# Patient Record
Sex: Male | Born: 1965 | Hispanic: No | Marital: Married | Smoking: Never smoker
Health system: Southern US, Community
[De-identification: ages and names within clinical notes are randomized; demographics above are authoritative.]

## PROBLEM LIST (undated history)

## (undated) DIAGNOSIS — I10 Essential (primary) hypertension: Secondary | ICD-10-CM

## (undated) HISTORY — PX: RENAL ARTERY STENT: SHX2321

## (undated) HISTORY — PX: CORONARY ANGIOPLASTY WITH STENT PLACEMENT: SHX49

---

## 2010-01-12 ENCOUNTER — Inpatient Hospital Stay (HOSPITAL_COMMUNITY): Admission: EM | Admit: 2010-01-12 | Discharge: 2010-01-21 | Payer: Self-pay | Admitting: Emergency Medicine

## 2010-01-12 ENCOUNTER — Encounter: Payer: Self-pay | Admitting: Infectious Disease

## 2010-01-12 ENCOUNTER — Ambulatory Visit: Payer: Self-pay | Admitting: Infectious Disease

## 2010-01-21 ENCOUNTER — Encounter: Payer: Self-pay | Admitting: Internal Medicine

## 2010-01-21 DIAGNOSIS — I2510 Atherosclerotic heart disease of native coronary artery without angina pectoris: Secondary | ICD-10-CM | POA: Insufficient documentation

## 2010-01-21 DIAGNOSIS — E785 Hyperlipidemia, unspecified: Secondary | ICD-10-CM

## 2010-01-21 DIAGNOSIS — N259 Disorder resulting from impaired renal tubular function, unspecified: Secondary | ICD-10-CM | POA: Insufficient documentation

## 2010-01-21 DIAGNOSIS — I10 Essential (primary) hypertension: Secondary | ICD-10-CM | POA: Insufficient documentation

## 2010-01-28 ENCOUNTER — Inpatient Hospital Stay (HOSPITAL_COMMUNITY): Admission: EM | Admit: 2010-01-28 | Discharge: 2010-01-28 | Payer: Self-pay | Admitting: Emergency Medicine

## 2010-12-16 NOTE — Miscellaneous (Signed)
Summary: Hospital Discharge Update  Hospital Discharge  Date of admission:01/12/2010  Date of discharge:01/21/2010  Brief reason for admission/active problems: 1) Hypertensive emergency s/p cardiac cath, PCI RCA with bare metal stent  Followup needed: 1) Blood pressure 2) Medication compliance 3) BMet  4) Arrange for CT vs MRI abd for further work up of secondary HTN, urine metanephrines elevated  The medication and problem lists have been updated.  Please see the dictated discharge summary for details.

## 2010-12-16 NOTE — Miscellaneous (Signed)
Summary: Clinical List Update    Problems: Added new problem of CORONARY ARTERY DISEASE (ICD-414.00) Added new problem of HYPERLIPIDEMIA (ICD-272.4) Added new problem of HYPERTENSION (ICD-401.9) Added new problem of RENAL INSUFFICIENCY (ICD-588.9) Added new problem of FAMILY HISTORY OF CAD MALE 1ST DEGREE RELATIVE <50 (ICD-V17.3) Added new problem of FAMILY HISTORY OF ANEURYSM AORTIC (ICD-V17.4) Medications: Added new medication of ISOSORBIDE DINITRATE 30 MG TABS (ISOSORBIDE DINITRATE) Take 1 tablet by mouth once a day Added new medication of HYDRALAZINE HCL 50 MG TABS (HYDRALAZINE HCL) Take 1 tablet by mouth four times a day Added new medication of CARVEDILOL 25 MG TABS (CARVEDILOL) Take 1 tablet by mouth two times a day Added new medication of AMLODIPINE BESYLATE 10 MG TABS (AMLODIPINE BESYLATE) Take 1 tablet by mouth once a day Added new medication of THIAMINE 50 MG CAPS (THIAMINE HCL) Take 1 tablet by mouth once a day Added new medication of FOLIC ACID 1 MG TABS (FOLIC ACID) Take 1 tablet by mouth once a day Added new medication of SIMVASTATIN 10 MG TABS (SIMVASTATIN) Take 1 tab by mouth at bedtime Added new medication of FUROSEMIDE 40 MG TABS (FUROSEMIDE) Take 1 tablet by mouth once a day Added new medication of ASPIRIN 325 MG TABS (ASPIRIN) Take 1 tablet by mouth once a day Added new medication of PLAVIX 75 MG TABS (CLOPIDOGREL BISULFATE) Take 1 tablet by mouth once a day as directed by cardiologist. Added new medication of CLONAZEPAM 1 MG TABS (CLONAZEPAM) Take 1 tablet by mouth two times a day Allergies: Added new allergy or adverse reaction of * CONTRAST DYE Observations: Added new observation of INSTRUCTIONS: Please follow up with Dr. Mayford Knife on March 22, 2011at 2:15 pm. Dr. Norris Cross office number is [phone] Please follow up with Dr. Baltazar Apo of the [facility] on February 13, 2010 at 1:30 pm.  Please take all medications as prescribed. Check your Blood Pressure  regularly. If it is above 180: you should make an appointment. It is not healthy  for men to drink more than 2-3 drinks per day. Please refrain from alcohol use or keep to <1 drink per day.  (01/21/2010 15:40) Added new observation of MEDRECON: current updated (01/21/2010 15:40) Added new observation of ALLERGY REV: Done (01/21/2010 15:40) Added new observation of NKA: F (01/21/2010 15:40) Added new observation of SOCIAL HX: Occupation: Retired Sales executive, currently works as a Dietitian  Lives with wife and two children Chews tobacco Hx of heavy alcohol use, trying to cut back   (01/21/2010 15:40) Added new observation of WORK STATUS: employed (01/21/2010 15:40) Added new observation of FH HTN: Family History Hypertension (01/21/2010 15:40) Added new observation of FH-HIGHCHOL: Family History High cholesterol (01/21/2010 15:40) Added new observation of FHAORTAANEUR: Family History of Aneurysm Aortic (01/21/2010 15:40) Added new observation of FAMILY HX: Family History of CAD Male 1st degree relative <50 Family History of Aneurysm Aortic Family History High cholesterol Family History Hypertension  (01/21/2010 15:40) Added new observation of FH PREM CAD: Family History of CAD Male 1st degree relative <50 (01/21/2010 15:40) Added new observation of PMH ANGPL/ST: yes (01/21/2010 15:40) Added new observation of PAST SURG HX: Cardiac cath 01/20/2010 PTCA/stent  (01/21/2010 15:40) Added new observation of PAST MED HX: Coronary artery disease 2 vessel disease, s/p cardiac cath, PCI of RCA with bare mental stent, RCA 80% occlusion Hyperlipidemia Hypertension Renal insufficiency Hx of alcohol use   (01/21/2010 15:40) Added new observation of HX RENAL BAD: yes (01/21/2010 15:40) Added new observation  of HX OF HTN: yes (01/21/2010 15:40) Added new observation of HYPRLIPIDMIA: yes (01/21/2010 15:40) Added new observation of HX  CAD: yes (01/21/2010 15:40)      Complete  Medication List: 1)  Isosorbide Dinitrate 30 Mg Tabs (Isosorbide dinitrate) .... Take 1 tablet by mouth once a day 2)  Hydralazine Hcl 50 Mg Tabs (Hydralazine hcl) .... Take 1 tablet by mouth four times a day 3)  Carvedilol 25 Mg Tabs (Carvedilol) .... Take 1 tablet by mouth two times a day 4)  Amlodipine Besylate 10 Mg Tabs (Amlodipine besylate) .... Take 1 tablet by mouth once a day 5)  Thiamine 50 Mg Caps (Thiamine hcl) .... Take 1 tablet by mouth once a day 6)  Folic Acid 1 Mg Tabs (Folic acid) .... Take 1 tablet by mouth once a day 7)  Simvastatin 10 Mg Tabs (Simvastatin) .... Take 1 tab by mouth at bedtime 8)  Furosemide 40 Mg Tabs (Furosemide) .... Take 1 tablet by mouth once a day 9)  Aspirin 325 Mg Tabs (Aspirin) .... Take 1 tablet by mouth once a day 10)  Plavix 75 Mg Tabs (Clopidogrel bisulfate) .... Take 1 tablet by mouth once a day as directed by cardiologist. 11)  Clonazepam 1 Mg Tabs (Clonazepam) .... Take 1 tablet by mouth two times a day   Current Medications (verified): 1)  Isosorbide Dinitrate 30 Mg Tabs (Isosorbide Dinitrate) .... Take 1 Tablet By Mouth Once A Day 2)  Hydralazine Hcl 50 Mg Tabs (Hydralazine Hcl) .... Take 1 Tablet By Mouth Four Times A Day 3)  Carvedilol 25 Mg Tabs (Carvedilol) .... Take 1 Tablet By Mouth Two Times A Day 4)  Amlodipine Besylate 10 Mg Tabs (Amlodipine Besylate) .... Take 1 Tablet By Mouth Once A Day 5)  Thiamine 50 Mg Caps (Thiamine Hcl) .... Take 1 Tablet By Mouth Once A Day 6)  Folic Acid 1 Mg Tabs (Folic Acid) .... Take 1 Tablet By Mouth Once A Day 7)  Simvastatin 10 Mg Tabs (Simvastatin) .... Take 1 Tab By Mouth At Bedtime 8)  Furosemide 40 Mg Tabs (Furosemide) .... Take 1 Tablet By Mouth Once A Day 9)  Aspirin 325 Mg Tabs (Aspirin) .... Take 1 Tablet By Mouth Once A Day 10)  Plavix 75 Mg Tabs (Clopidogrel Bisulfate) .... Take 1 Tablet By Mouth Once A Day As Directed By Cardiologist. 11)  Clonazepam 1 Mg Tabs (Clonazepam) .... Take 1  Tablet By Mouth Two Times A Day  Allergies (verified): 1)  ! * Contrast Dye   Patient Instructions: 1)  Please follow up with Dr. Mayford Knife on March 22, 2011at 2:15 pm. Dr. Norris Cross office number is [phone] 2)  Please follow up with Dr. Baltazar Apo of the [facility] on February 13, 2010 at 1:30 pm.  3)  Please take all medications as prescribed. 4)  Check your Blood Pressure regularly. If it is above 180: you should make an appointment. 5)  It is not healthy  for men to drink more than 2-3 drinks per day. Please refrain from alcohol use or keep to <1 drink per day.    Past History:  Past Medical History: Coronary artery disease 2 vessel disease, s/p cardiac cath, PCI of RCA with bare mental stent, RCA 80% occlusion Hyperlipidemia Hypertension Renal insufficiency Hx of alcohol use   Past Surgical History: Cardiac cath 01/20/2010 PTCA/stent   Family History: Family History of CAD Male 1st degree relative <50 Family History of Aneurysm Aortic Family History High cholesterol  Family History Hypertension  Social History: Occupation: Retired Sales executive, currently works as a Dietitian  Lives with wife and two children Chews tobacco Hx of heavy alcohol use, trying to cut back

## 2011-02-05 LAB — APTT: aPTT: 37 seconds (ref 24–37)

## 2011-02-05 LAB — HEPATIC FUNCTION PANEL
ALT: 23 U/L (ref 0–53)
AST: 27 U/L (ref 0–37)
Albumin: 3.3 g/dL — ABNORMAL LOW (ref 3.5–5.2)
Alkaline Phosphatase: 72 U/L (ref 39–117)

## 2011-02-05 LAB — POCT I-STAT, CHEM 8
BUN: 23 mg/dL (ref 6–23)
Calcium, Ion: 1.04 mmol/L — ABNORMAL LOW (ref 1.12–1.32)
Chloride: 102 mEq/L (ref 96–112)
Creatinine, Ser: 1.6 mg/dL — ABNORMAL HIGH (ref 0.4–1.5)
TCO2: 24 mmol/L (ref 0–100)

## 2011-02-05 LAB — BASIC METABOLIC PANEL
BUN: 27 mg/dL — ABNORMAL HIGH (ref 6–23)
GFR calc Af Amer: 41 mL/min — ABNORMAL LOW (ref >60)
GFR calc non Af Amer: 34 mL/min — ABNORMAL LOW (ref >60)
GFR calc non Af Amer: 41 mL/min — ABNORMAL LOW (ref >60)
Glucose, Bld: 110 mg/dL — ABNORMAL HIGH (ref 70–99)
Potassium: 3.4 mEq/L — ABNORMAL LOW (ref 3.5–5.1)
Potassium: 3.7 mEq/L (ref 3.5–5.1)
Sodium: 132 mEq/L — ABNORMAL LOW (ref 135–145)

## 2011-02-05 LAB — DIFFERENTIAL
Eosinophils Absolute: 0 10*3/uL (ref 0.0–0.7)
Lymphocytes Relative: 7 % — ABNORMAL LOW (ref 12–46)
Neutro Abs: 13.3 10*3/uL — ABNORMAL HIGH (ref 1.7–7.7)

## 2011-02-05 LAB — LIPID PANEL
Cholesterol: 161 mg/dL (ref 0–200)
HDL: 30 mg/dL — ABNORMAL LOW (ref >39)
Total CHOL/HDL Ratio: 5.4 RATIO

## 2011-02-05 LAB — URINALYSIS, ROUTINE W REFLEX MICROSCOPIC
Glucose, UA: NEGATIVE mg/dL
Leukocytes, UA: NEGATIVE
Protein, ur: >300 mg/dL — AB
Specific Gravity, Urine: 1.016 (ref 1.005–1.030)
pH: 6 (ref 5.0–8.0)

## 2011-02-05 LAB — ALDOSTERONE, URINE, 24 HOUR: Volume, Urine-ALDU: 4400 mL

## 2011-02-05 LAB — PROTIME-INR
INR: 1.58 — ABNORMAL HIGH (ref 0.00–1.49)
INR: 1.71 — ABNORMAL HIGH (ref 0.00–1.49)
Prothrombin Time: 19.9 seconds — ABNORMAL HIGH (ref 11.6–15.2)

## 2011-02-05 LAB — COMPREHENSIVE METABOLIC PANEL
Albumin: 3 g/dL — ABNORMAL LOW (ref 3.5–5.2)
Creatinine, Ser: 1.37 mg/dL (ref 0.4–1.5)
GFR calc non Af Amer: 56 mL/min — ABNORMAL LOW (ref >60)
Glucose, Bld: 159 mg/dL — ABNORMAL HIGH (ref 70–99)
Potassium: 3 mEq/L — ABNORMAL LOW (ref 3.5–5.1)
Total Protein: 6.3 g/dL (ref 6.0–8.3)

## 2011-02-05 LAB — CBC
HCT: 40.9 % (ref 39.0–52.0)
Hemoglobin: 13.2 g/dL (ref 13.0–17.0)
MCHC: 34.3 g/dL (ref 30.0–36.0)
MCV: 86.9 fL (ref 78.0–100.0)
RBC: 4.38 MIL/uL (ref 4.22–5.81)
WBC: 14.9 10*3/uL — ABNORMAL HIGH (ref 4.0–10.5)

## 2011-02-05 LAB — MRSA PCR SCREENING: MRSA by PCR: NEGATIVE

## 2011-02-05 LAB — CATECHOLAMINES, FRACTIONATED, URINE, 24 HOUR
Epinephrine 24 Hr Urine: 11 mcg/24 h (ref 2–24)
Norepinephrine 24 Hr Urine: 110 mcg/24 h — ABNORMAL HIGH (ref 15–100)

## 2011-02-05 LAB — CK TOTAL AND CKMB (NOT AT ARMC)
Relative Index: INVALID (ref 0.0–2.5)
Total CK: 47 U/L (ref 7–232)

## 2011-02-05 LAB — MAGNESIUM: Magnesium: 1.9 mg/dL (ref 1.5–2.5)

## 2011-02-05 LAB — METANEPHRINES, URINE, 24 HOUR
Metanephrines, Ur: 456 mcg/24 h — ABNORMAL HIGH (ref 58–203)
Volume, Urine-METAN: 4400 mL

## 2011-02-05 LAB — URINE MICROSCOPIC-ADD ON

## 2011-02-05 LAB — CARDIAC PANEL(CRET KIN+CKTOT+MB+TROPI)
CK, MB: 1.8 ng/mL (ref 0.3–4.0)
Troponin I: 0.25 ng/mL — ABNORMAL HIGH (ref 0.00–0.06)

## 2011-02-05 LAB — HEPARIN LEVEL (UNFRACTIONATED): Heparin Unfractionated: <0.1 IU/mL — ABNORMAL LOW (ref 0.30–0.70)

## 2011-02-05 LAB — BRAIN NATRIURETIC PEPTIDE: Pro B Natriuretic peptide (BNP): 1056 pg/mL — ABNORMAL HIGH (ref 0.0–100.0)

## 2011-02-05 LAB — VMA + CREATININE, URINE (TIMED COLLECTION): Volume, Urine-VMAUR: 4400 mL

## 2011-02-05 LAB — CREATININE, URINE, RANDOM: Creatinine, Urine: 125.9 mg/dL

## 2011-02-05 LAB — ALDOSTERONE + RENIN ACTIVITY W/ RATIO
ALDO / PRA Ratio: 121.7 Ratio — ABNORMAL HIGH (ref 0.9–28.9)
PRA LC/MS/MS: 0.23 ng/mL/h — ABNORMAL LOW (ref 0.25–5.82)

## 2011-02-05 LAB — TROPONIN I: Troponin I: 0.07 ng/mL — ABNORMAL HIGH (ref 0.00–0.06)

## 2011-02-09 LAB — COMPREHENSIVE METABOLIC PANEL
Albumin: 3.1 g/dL — ABNORMAL LOW (ref 3.5–5.2)
Alkaline Phosphatase: 83 U/L (ref 39–117)
BUN: 23 mg/dL (ref 6–23)
Chloride: 101 mEq/L (ref 96–112)
Creatinine, Ser: 1.47 mg/dL (ref 0.4–1.5)
GFR calc non Af Amer: 52 mL/min — ABNORMAL LOW (ref >60)
Glucose, Bld: 90 mg/dL (ref 70–99)
Potassium: 4.3 mEq/L (ref 3.5–5.1)
Total Bilirubin: 0.8 mg/dL (ref 0.3–1.2)

## 2011-02-09 LAB — BASIC METABOLIC PANEL
BUN: 13 mg/dL (ref 6–23)
BUN: 13 mg/dL (ref 6–23)
BUN: 14 mg/dL (ref 6–23)
BUN: 14 mg/dL (ref 6–23)
CO2: 20 mEq/L (ref 19–32)
CO2: 20 mEq/L (ref 19–32)
CO2: 22 mEq/L (ref 19–32)
CO2: 24 mEq/L (ref 19–32)
Calcium: 8.2 mg/dL — ABNORMAL LOW (ref 8.4–10.5)
Calcium: 8.2 mg/dL — ABNORMAL LOW (ref 8.4–10.5)
Calcium: 8.5 mg/dL (ref 8.4–10.5)
Calcium: 8.6 mg/dL (ref 8.4–10.5)
Calcium: 8.8 mg/dL (ref 8.4–10.5)
Chloride: 101 mEq/L (ref 96–112)
Chloride: 104 mEq/L (ref 96–112)
Chloride: 106 mEq/L (ref 96–112)
Chloride: 108 mEq/L (ref 96–112)
Chloride: 96 mEq/L (ref 96–112)
Creatinine, Ser: 1.37 mg/dL (ref 0.4–1.5)
Creatinine, Ser: 1.42 mg/dL (ref 0.4–1.5)
Creatinine, Ser: 1.51 mg/dL — ABNORMAL HIGH (ref 0.4–1.5)
Creatinine, Ser: 1.57 mg/dL — ABNORMAL HIGH (ref 0.4–1.5)
Creatinine, Ser: 2.33 mg/dL — ABNORMAL HIGH (ref 0.4–1.5)
GFR calc Af Amer: 37 mL/min — ABNORMAL LOW (ref >60)
GFR calc Af Amer: 58 mL/min — ABNORMAL LOW (ref >60)
GFR calc Af Amer: 59 mL/min — ABNORMAL LOW (ref >60)
GFR calc Af Amer: 60 mL/min — ABNORMAL LOW (ref >60)
GFR calc Af Amer: >60 mL/min (ref >60)
GFR calc non Af Amer: 30 mL/min — ABNORMAL LOW (ref >60)
GFR calc non Af Amer: 42 mL/min — ABNORMAL LOW (ref >60)
GFR calc non Af Amer: 49 mL/min — ABNORMAL LOW (ref >60)
GFR calc non Af Amer: 54 mL/min — ABNORMAL LOW (ref >60)
Glucose, Bld: 102 mg/dL — ABNORMAL HIGH (ref 70–99)
Glucose, Bld: 85 mg/dL (ref 70–99)
Glucose, Bld: 93 mg/dL (ref 70–99)
Glucose, Bld: 94 mg/dL (ref 70–99)
Potassium: 3.6 mEq/L (ref 3.5–5.1)
Potassium: 3.7 mEq/L (ref 3.5–5.1)
Potassium: 3.7 mEq/L (ref 3.5–5.1)
Potassium: 3.9 mEq/L (ref 3.5–5.1)
Potassium: 4.1 mEq/L (ref 3.5–5.1)
Potassium: 4.5 mEq/L (ref 3.5–5.1)
Sodium: 134 mEq/L — ABNORMAL LOW (ref 135–145)
Sodium: 134 mEq/L — ABNORMAL LOW (ref 135–145)
Sodium: 134 mEq/L — ABNORMAL LOW (ref 135–145)
Sodium: 134 mEq/L — ABNORMAL LOW (ref 135–145)
Sodium: 135 mEq/L (ref 135–145)
Sodium: 136 mEq/L (ref 135–145)

## 2011-02-09 LAB — CBC
HCT: 36.7 % — ABNORMAL LOW (ref 39.0–52.0)
HCT: 36.8 % — ABNORMAL LOW (ref 39.0–52.0)
HCT: 37.8 % — ABNORMAL LOW (ref 39.0–52.0)
HCT: 40.6 % (ref 39.0–52.0)
HCT: 42.1 % (ref 39.0–52.0)
Hemoglobin: 12.4 g/dL — ABNORMAL LOW (ref 13.0–17.0)
Hemoglobin: 13.2 g/dL (ref 13.0–17.0)
Hemoglobin: 13.6 g/dL (ref 13.0–17.0)
Hemoglobin: 14.1 g/dL (ref 13.0–17.0)
MCHC: 33.7 g/dL (ref 30.0–36.0)
MCHC: 34 g/dL (ref 30.0–36.0)
MCV: 86.2 fL (ref 78.0–100.0)
MCV: 86.4 fL (ref 78.0–100.0)
MCV: 86.6 fL (ref 78.0–100.0)
MCV: 87.2 fL (ref 78.0–100.0)
Platelets: 245 10*3/uL (ref 150–400)
Platelets: 258 10*3/uL (ref 150–400)
Platelets: 264 10*3/uL (ref 150–400)
Platelets: 276 10*3/uL (ref 150–400)
Platelets: 364 10*3/uL (ref 150–400)
RBC: 4.15 MIL/uL — ABNORMAL LOW (ref 4.22–5.81)
RBC: 4.21 MIL/uL — ABNORMAL LOW (ref 4.22–5.81)
RBC: 4.35 MIL/uL (ref 4.22–5.81)
RBC: 4.58 MIL/uL (ref 4.22–5.81)
RBC: 4.61 MIL/uL (ref 4.22–5.81)
RBC: 4.89 MIL/uL (ref 4.22–5.81)
RDW: 15.9 % — ABNORMAL HIGH (ref 11.5–15.5)
RDW: 16.2 % — ABNORMAL HIGH (ref 11.5–15.5)
WBC: 10.2 10*3/uL (ref 4.0–10.5)
WBC: 10.3 10*3/uL (ref 4.0–10.5)
WBC: 10.4 10*3/uL (ref 4.0–10.5)
WBC: 12.7 10*3/uL — ABNORMAL HIGH (ref 4.0–10.5)
WBC: 8.2 10*3/uL (ref 4.0–10.5)
WBC: 9.3 10*3/uL (ref 4.0–10.5)
WBC: 9.7 10*3/uL (ref 4.0–10.5)

## 2011-02-09 LAB — CLOSTRIDIUM DIFFICILE EIA

## 2011-02-09 LAB — TROPONIN I: Troponin I: 0.06 ng/mL (ref 0.00–0.06)

## 2011-02-09 LAB — DIFFERENTIAL
Eosinophils Absolute: 0.1 10*3/uL (ref 0.0–0.7)
Lymphs Abs: 1 10*3/uL (ref 0.7–4.0)
Monocytes Absolute: 0.8 10*3/uL (ref 0.1–1.0)
Monocytes Relative: 6 % (ref 3–12)
Neutrophils Relative %: 84 % — ABNORMAL HIGH (ref 43–77)

## 2011-02-09 LAB — GLUCOSE, CAPILLARY: Glucose-Capillary: 129 mg/dL — ABNORMAL HIGH (ref 70–99)

## 2011-02-09 LAB — HEPARIN LEVEL (UNFRACTIONATED)
Heparin Unfractionated: 0.25 IU/mL — ABNORMAL LOW (ref 0.30–0.70)
Heparin Unfractionated: 0.29 IU/mL — ABNORMAL LOW (ref 0.30–0.70)

## 2011-02-09 LAB — POCT CARDIAC MARKERS: Myoglobin, poc: 135 ng/mL (ref 12–200)

## 2011-02-09 LAB — CK TOTAL AND CKMB (NOT AT ARMC): CK, MB: 0.6 ng/mL (ref 0.3–4.0)

## 2011-02-09 LAB — BRAIN NATRIURETIC PEPTIDE: Pro B Natriuretic peptide (BNP): 970 pg/mL — ABNORMAL HIGH (ref 0.0–100.0)

## 2011-02-09 LAB — CARDIAC PANEL(CRET KIN+CKTOT+MB+TROPI)
Relative Index: INVALID (ref 0.0–2.5)
Troponin I: 0.03 ng/mL (ref 0.00–0.06)

## 2011-05-19 IMAGING — CR DG CHEST 1V PORT
1 series · 1 of 1 positions shown · non-contrast
Comparison: None.

CLINICAL DATA: Chest pain and short of breath.  Uncontrolled
hypertension.

PORTABLE CHEST - 1 VIEW

[AP]
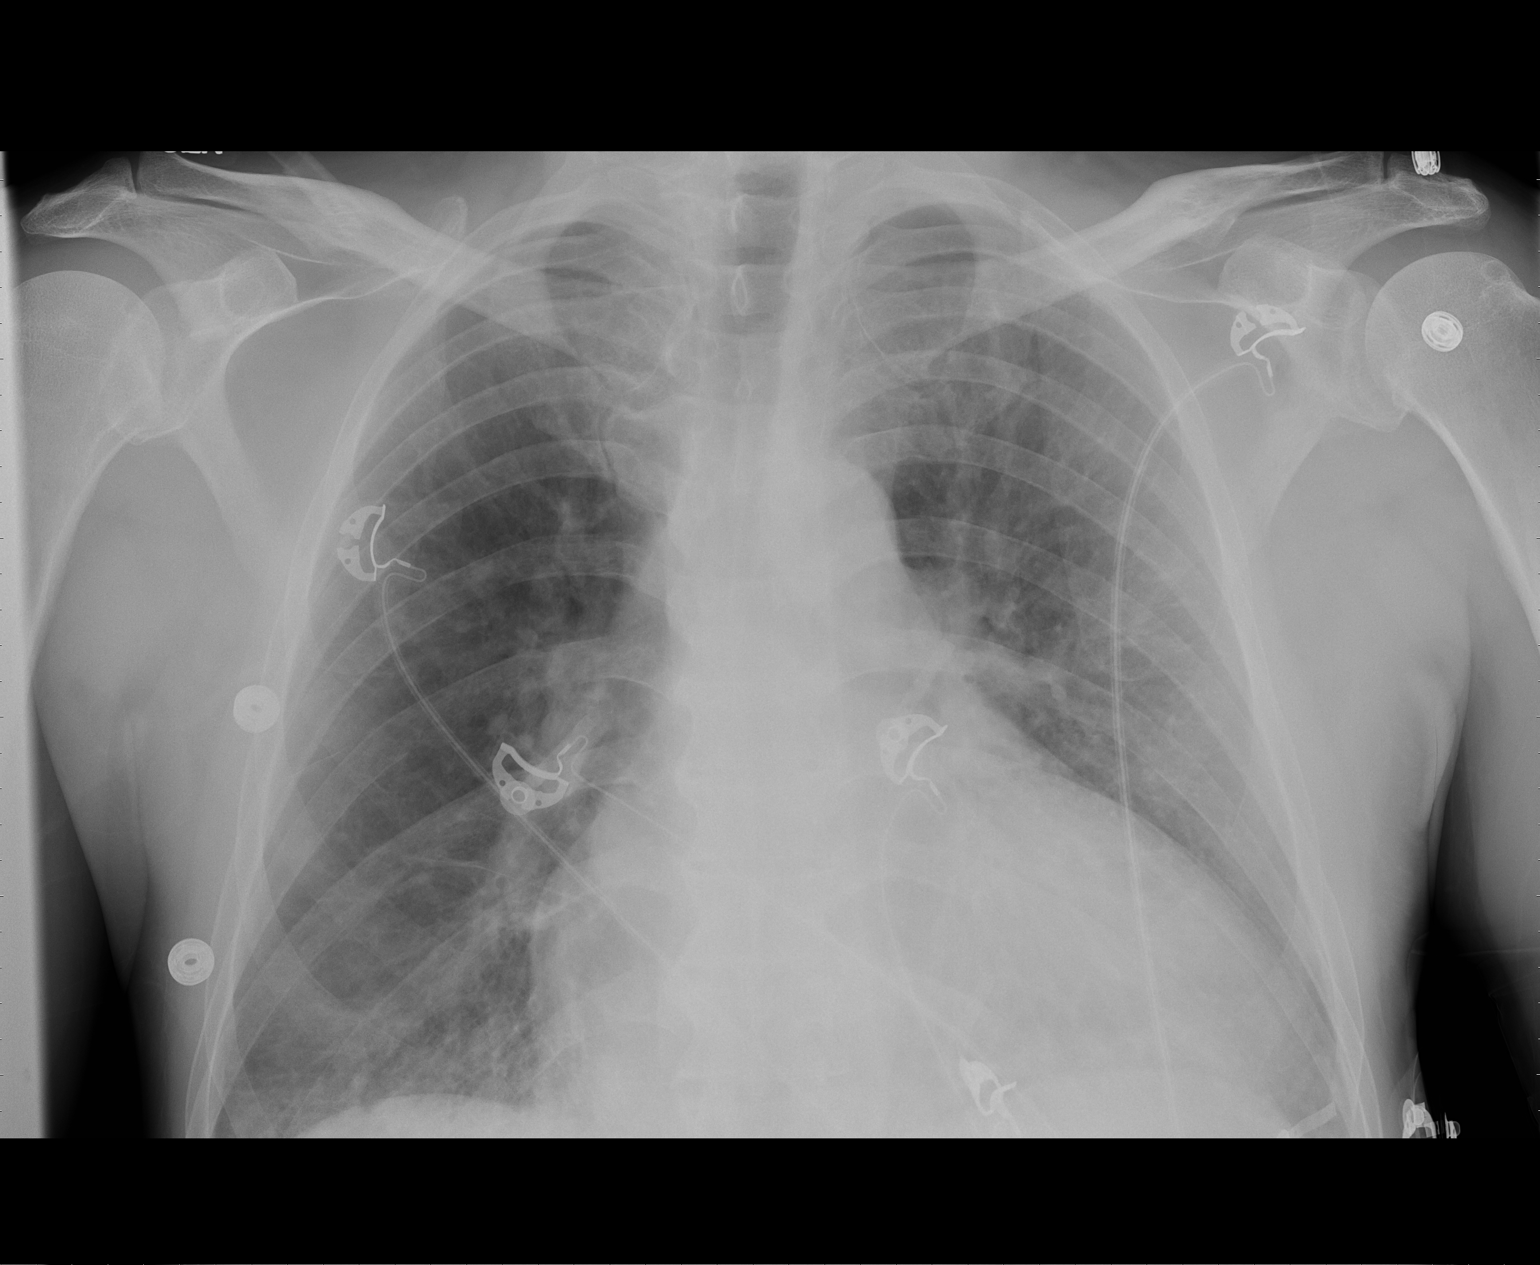

[1 of 1 positions shown; findings below may reference images not displayed]

FINDINGS: Moderate cardiomegaly is seen.  Mild infiltrate is seen
at the right lung base.  No evidence of pleural effusion.
IMPRESSION: Moderate cardiomegaly.  Mild infiltrate at the right lung base.
Acute infectious or inflammatory process cannot be excluded.

## 2011-05-19 IMAGING — CT CT ANGIO CHEST
2 of 8 series · 11 of 36 positions shown · IV contrast (100 ML OMNI 350)
Comparison: None

CTA CHEST

CLINICAL DATA: Severe chest and back pain.

CT ANGIOGRAPHY CHEST AND ABDOMEN
TECHNIQUE: Multidetector CT imaging of the chest and abdomen was
performed using the standard protocol during bolus administration
of intravenous contrast.  Multiplanar CT image reconstructions
including MIPs were obtained to evaluate the vascular anatomy.
Contrast: 100 ml Omnipaque- 350.

[Series 4: aortic dissection · axial · 0.77mm/px · z∈[-436,-79]mm · 10 of 175 slices shown]
[im 16/175  lung]
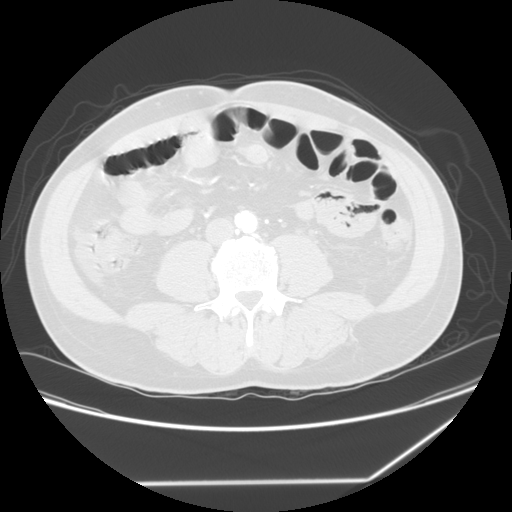
[im 32/175  mediastinal]
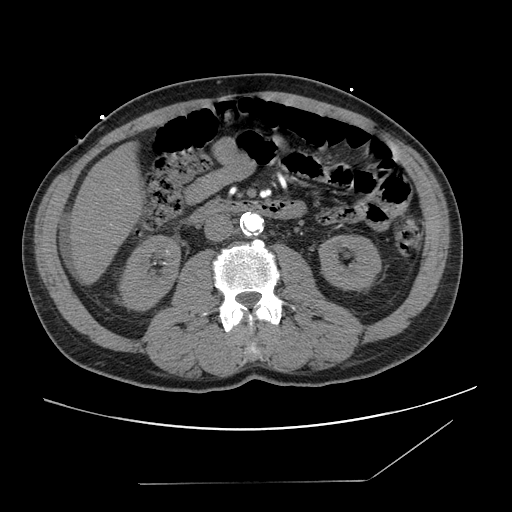
[im 48/175  lung]
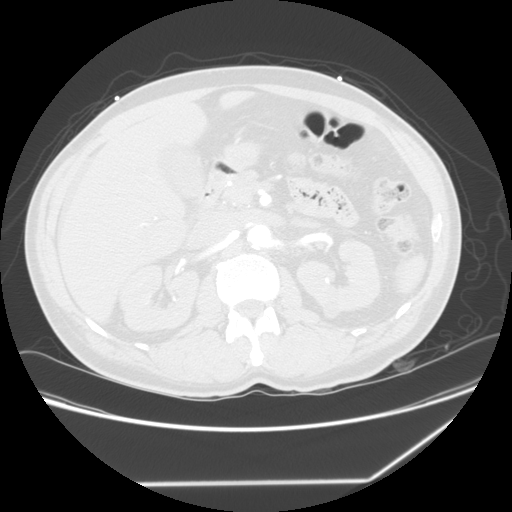
[im 64/175  mediastinal]
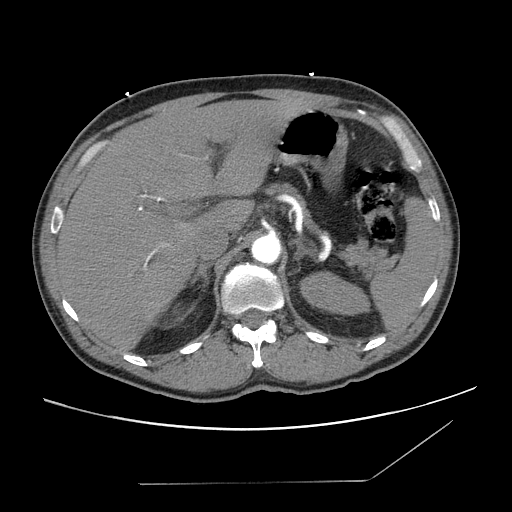
[im 80/175  lung]
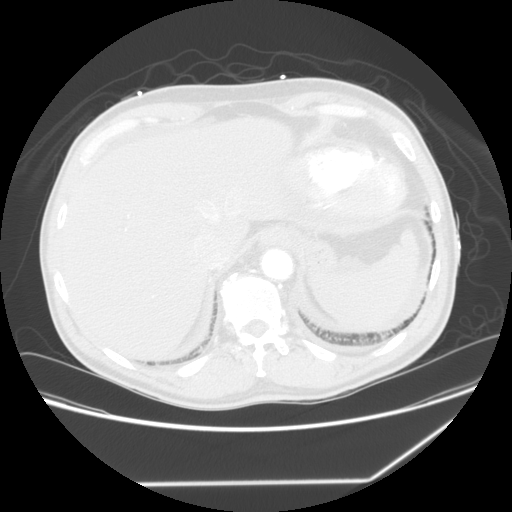
[im 95/175  mediastinal]
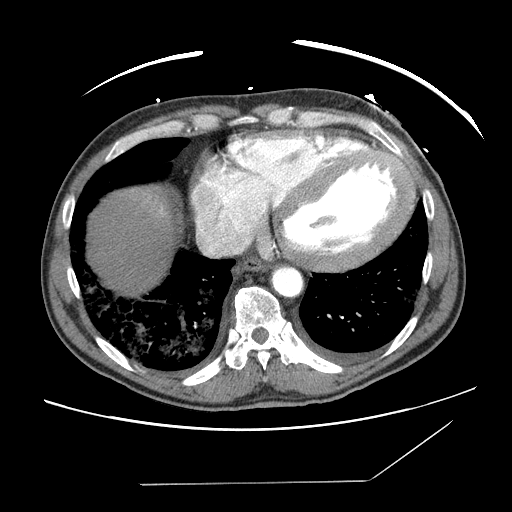
[im 111/175  lung]
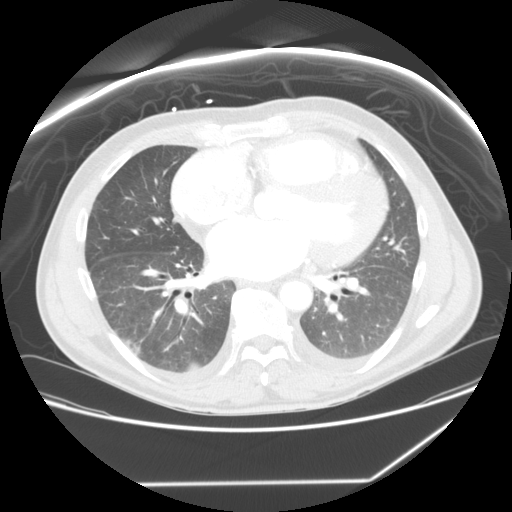
[im 127/175  mediastinal]
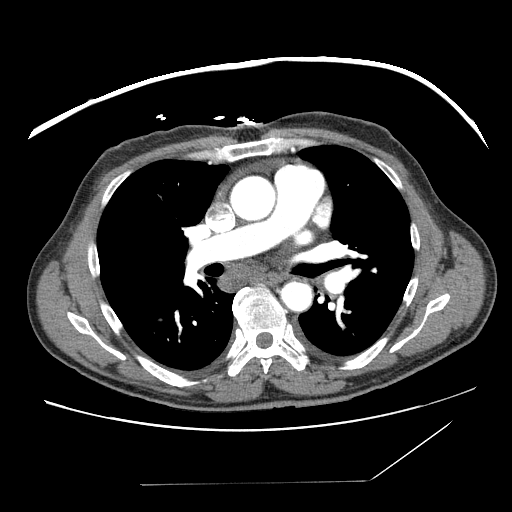
[im 143/175  lung]
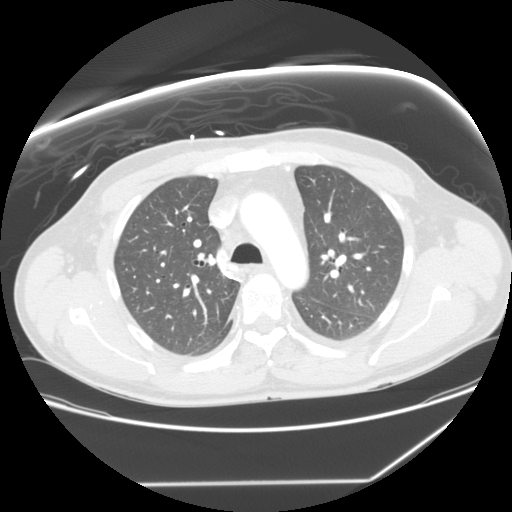
[im 159/175  mediastinal]
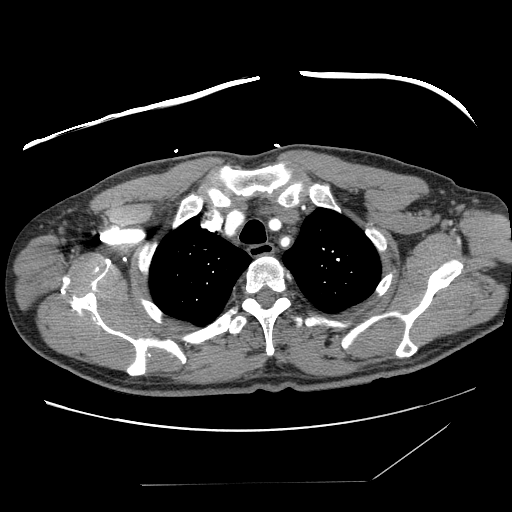

[Series 600: cor mpr · coronal · 0.87mm/px · 1 of 134 slices shown]
[im 67/134  mediastinal]
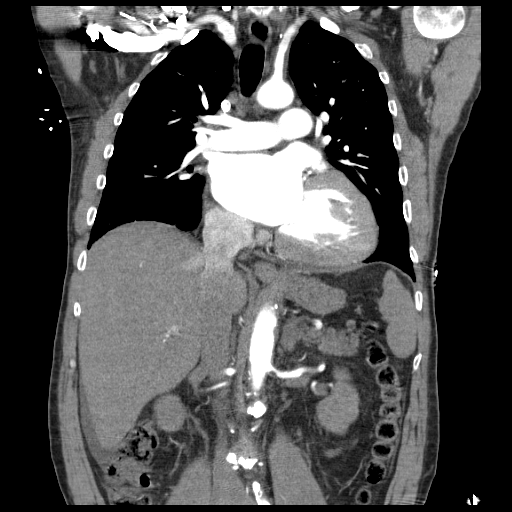

[11 of 36 positions shown; findings below may reference images not displayed]

FINDINGS: The chest wall is unremarkable.  No supraclavicular or
axillary adenopathy.  The bony thorax is intact.  Moderate
degenerative changes throughout the thoracic spine for age.

The heart is enlarged globally.  No pericardial effusion.
Borderline enlarged mediastinal and hilar lymph nodes.  The
esophagus is grossly normal.

The aorta is normal in caliber.  No dissection.  The major branch
vessels are normal  except for mild atherosclerotic disease
involving the origin of the left subclavian artery. Coronary artery
calcifications are noted.

The pulmonary arterial tree there is a fairly well opacified.  No
pulmonary emboli are seen.

Examination of the lung parenchyma demonstrates bibasilar airspace
opacities and interstitial thickening.  Findings likely due to
bronchopneumonia.  No definite pleural effusions.

There are too small pulmonary nodules in the right lung.  The right
upper lobe lesion on image number 20 measures 4.7 mm and the right
middle lobe lesion on image number 54 measures 6 mm.  A follow-up
noncontrast chest CT in 6 months is suggested.

 Review of the MIP images confirms the above findings.
IMPRESSION: 1.  No thoracic aortic dissection or aneurysm.
2.  Cardiac enlargement and coronary artery calcifications.
3.  Borderline enlarged mediastinal and hilar lymph nodes are
likely reactive/hyperplastic.
4.  Bibasilar infiltrates.
5.  Two small pulmonary nodules in the right lung.  Recommend
follow-up noncontrast chest CT in 6 months.

CTA ABDOMEN
FINDINGS: There are atherosclerotic changes involving the
abdominal aorta and branch vessels but no dissection or aneurysm.
Advanced atherosclerotic calcifications involving the common iliac
arteries but no dissection or aneurysm.

The liver, spleen, pancreas, adrenal glands and kidneys demonstrate
no significant findings.

The gallbladder wall is thickened and edematous.  Ther may be
pericholecystic fluid.  No obvious gallstones but acute
cholecystitis is possible.  Low albumin could also give this
appearance.  If the patient has right upper quadrant pain,
abdominal ultrasound examination is suggested.

The stomach, duodenum, small bowel and colon grossly normal but the
study is limited without oral contrast.  There is a small amount of
free abdominal fluid in the right paracolic gutter.

 Review of the MIP images confirms the above findings.
IMPRESSION: 1.  Atherosclerotic changes involving the aorta but no focal
aneurysm or dissection.
2.  Gallbladder wall thickening and possible pericholecystic fluid.
Acute cholecystitis is possible.  If this patient has right upper
quadrant pain an abdominal ultrasound examination is suggested.

## 2012-03-21 ENCOUNTER — Emergency Department (HOSPITAL_COMMUNITY)
Admission: EM | Admit: 2012-03-21 | Discharge: 2012-03-21 | Disposition: A | Discharge status: Home / Self Care | Attending: Emergency Medicine | Admitting: Emergency Medicine

## 2012-03-21 ENCOUNTER — Encounter (HOSPITAL_COMMUNITY): Payer: Self-pay | Admitting: Emergency Medicine

## 2012-03-21 DIAGNOSIS — Y92009 Unspecified place in unspecified non-institutional (private) residence as the place of occurrence of the external cause: Secondary | ICD-10-CM | POA: Insufficient documentation

## 2012-03-21 DIAGNOSIS — IMO0002 Reserved for concepts with insufficient information to code with codable children: Secondary | ICD-10-CM

## 2012-03-21 DIAGNOSIS — W269XXA Contact with unspecified sharp object(s), initial encounter: Secondary | ICD-10-CM | POA: Insufficient documentation

## 2012-03-21 DIAGNOSIS — S61409A Unspecified open wound of unspecified hand, initial encounter: Secondary | ICD-10-CM | POA: Insufficient documentation

## 2012-03-21 DIAGNOSIS — I10 Essential (primary) hypertension: Secondary | ICD-10-CM | POA: Insufficient documentation

## 2012-03-21 HISTORY — DX: Essential (primary) hypertension: I10

## 2012-03-21 MED ORDER — HYDROCODONE-ACETAMINOPHEN 5-325 MG PO TABS: 1.0000 | ORAL_TABLET | Freq: Four times a day (QID) | ORAL | Status: AC | PRN

## 2012-03-21 NOTE — ED Provider Notes (Signed)
History     CSN: [id number]  Arrival date & time 03/21/12  2143   First MD Initiated Contact with Patient 03/21/12 2208      Chief Complaint  Patient presents with  . Hand Injury    (Consider location/radiation/quality/duration/timing/severity/associated sxs/prior treatment) HPI Comments: tdap up to date  Patient is a 46 y.o. male presenting with hand injury. The history is provided by the patient.  Hand Injury  The incident occurred less than 1 hour ago. The incident occurred at home. The injury mechanism was an incision. The pain is present in the right hand. The quality of the pain is described as throbbing. The pain is at a severity of 3/10. The pain is mild. The pain has been fluctuating since the incident. Pertinent negatives include no fever. He reports no foreign bodies present. The symptoms are aggravated by palpation and movement. He has tried nothing for the symptoms. The treatment provided no relief.    Past Medical History  Diagnosis Date  . Hypertension     Past Surgical History  Procedure Date  . Coronary angioplasty with stent placement     Family History  Problem Relation Age of Onset  . Hypertension Other   . Coronary artery disease Other     History  Substance Use Topics  . Smoking status: Never Smoker   . Smokeless tobacco: Not on file  . Alcohol Use: No      Review of Systems  Constitutional: Negative for fever.  Gastrointestinal: Negative for nausea.  Skin: Positive for wound.  Neurological: Negative for weakness.  All other systems reviewed and are negative.    Allergies  Review of patient's allergies indicates no known allergies.  Home Medications   Current Outpatient Rx  Name Route Sig Dispense Refill  . AMLODIPINE BESYLATE 10 MG PO TABS Oral Take 10 mg by mouth daily.    Marland Kitchen CARVEDILOL 25 MG PO TABS Oral Take 50 mg by mouth 2 (two) times daily with a meal.    . CLONIDINE HCL 0.3 MG PO TABS Oral Take 0.3 mg by mouth 2 (two) times  daily.    Marland Kitchen CLOPIDOGREL BISULFATE 75 MG PO TABS Oral Take 75 mg by mouth daily.    Marland Kitchen HYDRALAZINE HCL 50 MG PO TABS Oral Take 50 mg by mouth 2 (two) times daily.    Marland Kitchen LISINOPRIL 40 MG PO TABS Oral Take 40 mg by mouth 2 (two) times daily.    Marland Kitchen SIMVASTATIN 20 MG PO TABS Oral Take 20 mg by mouth every evening.    Marland Kitchen TADALAFIL 5 MG PO TABS Oral Take 5 mg by mouth daily as needed. E.D.    . VITAMIN D (CHOLECALCIFEROL) PO Oral Take 1 tablet by mouth daily.      BP 208/101  Temp(Src) 98.5 F (36.9 C) (Oral)  Resp 20  SpO2 100%  Physical Exam  Nursing note and vitals reviewed. Constitutional: He is oriented to person, place, and time. He appears well-developed and well-nourished. No distress.  HENT:  Head: Normocephalic and atraumatic.  Eyes: Conjunctivae and EOM are normal.  Neck: Normal range of motion.  Pulmonary/Chest: Effort normal.  Musculoskeletal: Normal range of motion.  Neurological: He is alert and oriented to person, place, and time.  Skin: Skin is warm and dry. Laceration noted. No rash noted. He is not diaphoretic.       2 cm lac on dorsal surface of R hand just below the index finger  Psychiatric: He has a normal mood  and affect. His behavior is normal.    ED Course  Procedures (including critical care time)  Labs Reviewed - No data to display No results found.   No diagnosis found.  LACERATION REPAIR Performed by: Jaci Carrel Authorized by: Jaci Carrel Consent: Verbal consent obtained. Risks and benefits: risks, benefits and alternatives were discussed Consent given by: patient Patient identity confirmed: provided demographic data Prepped and Draped in normal sterile fashion Wound explored  Laceration Location: dorsal surface of right hand  Laceration Length: 2cm  No Foreign Bodies seen or palpated  Anesthesia: local infiltration  Local anesthetic: lidocaine 2% no epinephrine  Anesthetic total: 1.5 ml  Irrigation method: syringe Amount of  cleaning: standard  Skin closure: 4.0 prolene  Number of sutures: 3  Technique: simple interupted  Patient tolerance: Patient tolerated the procedure well with no immediate complications.  Pt blood pressure in ED was 208/101, discussed and pt denies any HA, vision, changes and does not have signs of end organ damage. Pt takes clonidine BID and has not had his evening dose  MDM  Laceration Hypertension  Tdap up to date. Pressure irrigation performed. Laceration occurred < 8 hours prior to repair which was well tolerated. Pt has no co morbidities to effect normal wound healing. Discussed suture home care w pt and answered questions. Pt to f-u for wound check and suture removal in 7 days. Pt is hemodynamically stable w no complaints prior to dc.  Recommended f-u with PCP to re-evaluate BP         Jaci Carrel, PA-C 03/21/12 2316

## 2012-03-21 NOTE — ED Notes (Signed)
Pt states he reached under his car seat to get his keys and something cut his left hand  Pt has a small laceration noted to his knuckle on his left hand

## 2012-03-21 NOTE — ED Provider Notes (Signed)
Medical screening examination/treatment/procedure(s) were performed by non-physician practitioner and as supervising physician I was immediately available for consultation/collaboration.   Lyanne Co, MD 03/21/12 [phone]

## 2012-07-11 ENCOUNTER — Emergency Department (HOSPITAL_COMMUNITY)

## 2012-07-11 ENCOUNTER — Encounter (HOSPITAL_COMMUNITY): Payer: Self-pay | Admitting: Emergency Medicine

## 2012-07-11 ENCOUNTER — Emergency Department (HOSPITAL_COMMUNITY)
Admission: EM | Admit: 2012-07-11 | Discharge: 2012-07-12 | Disposition: A | Discharge status: Home / Self Care | Attending: Emergency Medicine | Admitting: Emergency Medicine

## 2012-07-11 DIAGNOSIS — I517 Cardiomegaly: Secondary | ICD-10-CM | POA: Insufficient documentation

## 2012-07-11 DIAGNOSIS — Z79899 Other long term (current) drug therapy: Secondary | ICD-10-CM | POA: Insufficient documentation

## 2012-07-11 DIAGNOSIS — R079 Chest pain, unspecified: Secondary | ICD-10-CM | POA: Insufficient documentation

## 2012-07-11 DIAGNOSIS — R0789 Other chest pain: Secondary | ICD-10-CM

## 2012-07-11 DIAGNOSIS — F411 Generalized anxiety disorder: Secondary | ICD-10-CM | POA: Insufficient documentation

## 2012-07-11 DIAGNOSIS — I10 Essential (primary) hypertension: Secondary | ICD-10-CM | POA: Insufficient documentation

## 2012-07-11 LAB — CBC WITH DIFFERENTIAL/PLATELET
Basophils Absolute: 0 10*3/uL (ref 0.0–0.1)
Basophils Relative: 0 % (ref 0–1)
HCT: 39 % (ref 39.0–52.0)
Hemoglobin: 14.4 g/dL (ref 13.0–17.0)
Lymphocytes Relative: 22 % (ref 12–46)
MCHC: 36.9 g/dL — ABNORMAL HIGH (ref 30.0–36.0)
Monocytes Absolute: 0.6 10*3/uL (ref 0.1–1.0)
Monocytes Relative: 8 % (ref 3–12)
Neutro Abs: 5.3 10*3/uL (ref 1.7–7.7)
Neutrophils Relative %: 68 % (ref 43–77)
WBC: 7.8 10*3/uL (ref 4.0–10.5)

## 2012-07-11 LAB — COMPREHENSIVE METABOLIC PANEL
ALT: 22 U/L (ref 0–53)
BUN: 20 mg/dL (ref 6–23)
CO2: 29 mEq/L (ref 19–32)
Calcium: 9.7 mg/dL (ref 8.4–10.5)
Creatinine, Ser: 1.13 mg/dL (ref 0.50–1.35)
GFR calc Af Amer: 88 mL/min — ABNORMAL LOW (ref >90)
GFR calc non Af Amer: 76 mL/min — ABNORMAL LOW (ref >90)
Glucose, Bld: 154 mg/dL — ABNORMAL HIGH (ref 70–99)
Sodium: 132 mEq/L — ABNORMAL LOW (ref 135–145)
Total Protein: 8.2 g/dL (ref 6.0–8.3)

## 2012-07-11 LAB — POCT I-STAT TROPONIN I

## 2012-07-11 NOTE — ED Notes (Signed)
Pt not in room.

## 2012-07-11 NOTE — ED Notes (Signed)
PT reports that he has no real chest pain but mild discomfort that "doesnt feel right". Hx of stents- renal and cardiac. Denies N,V, SOB.

## 2012-07-12 ENCOUNTER — Encounter (HOSPITAL_COMMUNITY): Payer: Self-pay | Admitting: *Deleted

## 2012-07-12 LAB — TROPONIN I: Troponin I: <0.3 ng/mL (ref <0.30)

## 2012-07-12 LAB — POCT I-STAT TROPONIN I: Troponin i, poc: 0.01 ng/mL (ref 0.00–0.08)

## 2012-07-12 MED ORDER — LORAZEPAM 1 MG PO TABS
1.0000 mg | ORAL_TABLET | Freq: Once | ORAL | Status: AC
  Administered 2012-07-12: 1 mg via ORAL
  Filled 2012-07-12: qty 1

## 2012-07-12 MED ORDER — ASPIRIN 325 MG PO TABS
325.0000 mg | ORAL_TABLET | Freq: Every day | ORAL | Status: DC
  Administered 2012-07-12: 325 mg via ORAL
  Filled 2012-07-12: qty 1

## 2012-07-12 NOTE — ED Provider Notes (Signed)
Pt with abnormal feeling in his chest which has since resolved and has had 2 neg sets of CE's.  He has improved BP since arrival, denies any c/o at this time.  Lungs clear an heart without murmur - normal pulses and MS.  Stable for d/c to f/u with Cards - has f/u in 2 weeks with his cardiologist. Ceasar Mons Vitals:   07/12/12 0045  BP: 191/94  Pulse: 59  Temp:   Resp: 20  Medical screening examination/treatment/procedure(s) were conducted as a shared visit with non-physician practitioner(s) and myself.  I personally evaluated the patient during the encounter     Vida Roller, MD 07/12/12 [phone]

## 2012-07-12 NOTE — ED Notes (Signed)
Pt alert, NAD, calm, interactive, skin W&D, resps e/u, speaking in clear complete sentences, here for "chest discomfort/anxiousness" onset tonight, no h/o same, (denies: pain, sob, nvd, fever, cough, congestion, cold sx, numbness/ tingling, palpitations/ fluttering, weakness, feeling cold or other sx).

## 2012-07-12 NOTE — ED Notes (Signed)
EDPA at [facility] speaking with pt & family. Pt alert, NAD, calm, interactive.

## 2012-07-12 NOTE — ED Notes (Signed)
EDPA in to update with plan. Pt denies needs. Family leaving to go home at this time.

## 2012-07-12 NOTE — ED Notes (Signed)
HR noted to be low, pt sleeping, asymptomatic.

## 2012-07-12 NOTE — ED Provider Notes (Signed)
History     CSN: [id number]  Arrival date & time 07/11/12  2239   First MD Initiated Contact with Patient 07/12/12 0011      Chief Complaint  Patient presents with  . Chest Pain    (Consider location/radiation/quality/duration/timing/severity/associated sxs/prior treatment) HPI Comments: Steve Hayes 46 y.o. male   The chief complaint is: Patient presents with:   Chest Pain   The patient has medical history significant for:   Past Medical History:   Hypertension                                                Patient with history of cardiac and renal stents, presents with chest discomfort that he denies as pain or pressure but "anxious inside". Patient states that the feeling was on the left side and radiation to his back. Patient reports that he was given a script for anxiety medication 2 years ago but never took it. Patient states at the time of symptoms he took his blood pressure and it was 233/117. Denies SOB, CP, palpitations, nausea, vomiting, diaphoresis.       The history is provided by the patient.    Past Medical History  Diagnosis Date  . Hypertension     Past Surgical History  Procedure Date  . Coronary angioplasty with stent placement   . Renal artery stent     2013    Family History  Problem Relation Age of Onset  . Hypertension Other   . Coronary artery disease Other     History  Substance Use Topics  . Smoking status: Never Smoker   . Smokeless tobacco: Current User    Types: Chew  . Alcohol Use: Yes     rarely      Review of Systems  Constitutional: Negative for fever, chills and diaphoresis.  Respiratory: Negative for shortness of breath.   Cardiovascular: Negative for chest pain and palpitations.  Gastrointestinal: Negative for nausea and vomiting.  Psychiatric/Behavioral: The patient is nervous/anxious.   All other systems reviewed and are negative.    Allergies  Review of patient's allergies indicates no known  allergies.  Home Medications   Current Outpatient Rx  Name Route Sig Dispense Refill  . AMLODIPINE BESYLATE 10 MG PO TABS Oral Take 10 mg by mouth daily.    Marland Kitchen CARVEDILOL 25 MG PO TABS Oral Take 50 mg by mouth 2 (two) times daily with a meal.    . CLONIDINE HCL 0.3 MG PO TABS Oral Take 0.3 mg by mouth 2 (two) times daily.    Marland Kitchen CLOPIDOGREL BISULFATE 75 MG PO TABS Oral Take 75 mg by mouth daily.    Marland Kitchen HYDRALAZINE HCL 50 MG PO TABS Oral Take 50 mg by mouth 2 (two) times daily.    Marland Kitchen LISINOPRIL 40 MG PO TABS Oral Take 40 mg by mouth 2 (two) times daily.    Marland Kitchen SIMVASTATIN 20 MG PO TABS Oral Take 20 mg by mouth every evening.    Marland Kitchen TADALAFIL 5 MG PO TABS Oral Take 5 mg by mouth daily as needed. E.D.      BP 191/94  Pulse 59  Temp 98 F (36.7 C) (Oral)  Resp 20  SpO2 100%  Physical Exam  Nursing note and vitals reviewed. Constitutional: He appears well-developed and well-nourished.  HENT:  Head: Normocephalic and atraumatic.  Mouth/Throat: Oropharynx  is clear and moist.  Eyes: Conjunctivae and EOM are normal. No scleral icterus.  Neck: Normal range of motion. Neck supple.  Cardiovascular: Normal rate, regular rhythm and normal heart sounds.   Pulmonary/Chest: Effort normal and breath sounds normal.  Abdominal: Soft. Bowel sounds are normal. There is no tenderness.  Skin: Skin is warm and dry.    ED Course  Procedures (including critical care time)  Labs Reviewed  COMPREHENSIVE METABOLIC PANEL - Abnormal; Notable for the following:    Sodium 132 (*)     Chloride 92 (*)     Glucose, Bld 154 (*)     GFR calc non Af Amer 76 (*)     GFR calc Af Amer 88 (*)     All other components within normal limits  CBC WITH DIFFERENTIAL - Abnormal; Notable for the following:    MCHC 36.9 (*)     All other components within normal limits  POCT I-STAT TROPONIN I   Dg Chest 2 View  07/11/2012  *RADIOLOGY REPORT*  Clinical Data: Chest pain  CHEST - 2 VIEW  Comparison: 01/27/2010  Findings: Heart  size mildly enlarged.  Mild peribronchial thickening.  No focal consolidation.  No pleural effusion or pneumothorax.  Multilevel degenerative changes.  No acute osseous finding.  IMPRESSION: Mild cardiomegaly.  Mild bronchitic change without focal consolidation.   Original Report Authenticated By: Waneta Martins, M.D.     Date: 07/12/2012  Rate: 78  Rhythm: normal sinus rhythm  QRS Axis: normal  Intervals: normal  ST/T Wave abnormalities: normal  Conduction Disutrbances:none  Narrative Interpretation: LVH, No STEMI  Old EKG Reviewed: none available    1. Chest discomfort   2. Anxiety       MDM  Patient presented with "anxious feeling" in his chest. Patient given ativan and ASA in ER. Troponin and CXR: unremarkable. CBC & CMP: unremarkable. EKG: no stemi or ACS. Troponin repeated and pending. Patient signed out to Dr. Eber Hong.        Pixie Casino, PA-C 07/12/12 0150

## 2013-02-19 ENCOUNTER — Emergency Department (HOSPITAL_BASED_OUTPATIENT_CLINIC_OR_DEPARTMENT_OTHER): Payer: No Typology Code available for payment source

## 2013-02-19 ENCOUNTER — Encounter (HOSPITAL_BASED_OUTPATIENT_CLINIC_OR_DEPARTMENT_OTHER): Payer: Self-pay | Admitting: *Deleted

## 2013-02-19 ENCOUNTER — Emergency Department (HOSPITAL_BASED_OUTPATIENT_CLINIC_OR_DEPARTMENT_OTHER)
Admission: EM | Admit: 2013-02-19 | Discharge: 2013-02-19 | Disposition: A | Discharge status: Home / Self Care | Payer: No Typology Code available for payment source | Attending: Emergency Medicine | Admitting: Emergency Medicine

## 2013-02-19 DIAGNOSIS — S139XXA Sprain of joints and ligaments of unspecified parts of neck, initial encounter: Secondary | ICD-10-CM | POA: Insufficient documentation

## 2013-02-19 DIAGNOSIS — Y9389 Activity, other specified: Secondary | ICD-10-CM | POA: Insufficient documentation

## 2013-02-19 DIAGNOSIS — I10 Essential (primary) hypertension: Secondary | ICD-10-CM | POA: Insufficient documentation

## 2013-02-19 DIAGNOSIS — Y92410 Unspecified street and highway as the place of occurrence of the external cause: Secondary | ICD-10-CM | POA: Insufficient documentation

## 2013-02-19 DIAGNOSIS — Z9861 Coronary angioplasty status: Secondary | ICD-10-CM | POA: Insufficient documentation

## 2013-02-19 DIAGNOSIS — S298XXA Other specified injuries of thorax, initial encounter: Secondary | ICD-10-CM | POA: Insufficient documentation

## 2013-02-19 DIAGNOSIS — Z7902 Long term (current) use of antithrombotics/antiplatelets: Secondary | ICD-10-CM | POA: Insufficient documentation

## 2013-02-19 DIAGNOSIS — S0990XA Unspecified injury of head, initial encounter: Secondary | ICD-10-CM | POA: Insufficient documentation

## 2013-02-19 DIAGNOSIS — S161XXA Strain of muscle, fascia and tendon at neck level, initial encounter: Secondary | ICD-10-CM

## 2013-02-19 DIAGNOSIS — Z79899 Other long term (current) drug therapy: Secondary | ICD-10-CM | POA: Insufficient documentation

## 2013-02-19 MED ORDER — HYDROCODONE-ACETAMINOPHEN 5-325 MG PO TABS: 1.0000 | ORAL_TABLET | Freq: Four times a day (QID) | ORAL | Status: AC | PRN

## 2013-02-19 MED ORDER — NAPROXEN 500 MG PO TABS: 500.0000 mg | ORAL_TABLET | Freq: Two times a day (BID) | ORAL | Status: AC

## 2013-02-19 MED ORDER — CYCLOBENZAPRINE HCL 10 MG PO TABS: 10.0000 mg | ORAL_TABLET | Freq: Two times a day (BID) | ORAL | Status: AC | PRN
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
## 2015-06-24 ENCOUNTER — Ambulatory Visit: Attending: Audiology | Admitting: Audiology

## 2015-06-24 DIAGNOSIS — H903 Sensorineural hearing loss, bilateral: Secondary | ICD-10-CM

## 2015-06-24 DIAGNOSIS — Z77122 Contact with and (suspected) exposure to noise: Secondary | ICD-10-CM | POA: Insufficient documentation

## 2015-06-24 DIAGNOSIS — R292 Abnormal reflex: Secondary | ICD-10-CM | POA: Diagnosis present

## 2015-06-24 DIAGNOSIS — H93293 Other abnormal auditory perceptions, bilateral: Secondary | ICD-10-CM

## 2015-06-24 DIAGNOSIS — Z01118 Encounter for examination of ears and hearing with other abnormal findings: Secondary | ICD-10-CM | POA: Insufficient documentation

## 2015-06-24 DIAGNOSIS — H9193 Unspecified hearing loss, bilateral: Secondary | ICD-10-CM | POA: Diagnosis present

## 2015-06-24 DIAGNOSIS — H93213 Auditory recruitment, bilateral: Secondary | ICD-10-CM | POA: Diagnosis present

## 2015-06-24 DIAGNOSIS — R94128 Abnormal results of other function studies of ear and other special senses: Secondary | ICD-10-CM | POA: Diagnosis present

## 2015-06-24 NOTE — Procedures (Signed)
[facility] [address] [place], [place] [address] [phone]  AUDIOLOGICAL EVALUATION Name: Einer Meals DOB:  01-30-1966 MRN:  [id number]                              Diagnosis: Bilateral Hearing Loss Date: 06/24/2015   Referent: Raynelle Jan., MD  HISTORY: Mercie Eon, age 49 y.o. years, was seen for an audiological evaluation. He states that he has noticed increased difficulty with conversations and finds himself "withdrawing from social situations" because of his difficulty hearing.  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  Mercie Eon denies vertigo, balance, tinnitus or ear pressure issues. Mercie Eon reports having and being treated for hypertension.  He denies a family history of hearing loss.      EVALUATION: Pure tone air and tone conduction was completed using conventional audiometry with inserts. Symmetrical hearing thresholds of 40-45 dBHL from 250Hz  -1000Hz ; 50-60 dBHL from 2000Hz  - 8000Hz  bilaterally. The hearing loss in sensorineural bilaterally except for a slight mixed component on the left side at 250Hz  and 6000Hz . Speech reception thresholds are 25 dBHL in the right ear and 40 dBHL in the left ear using recorded spondee words.  The reliability is good. Word recognition is 100% at 75dBHL in each ear using recorded NU-6 word lists in quiet. In minimal background noise with +5dB signal to noise ratio word recogntion is 64% in the right ear and 60% in the left ear using recorded PBK word lists in recorded multitalker noise. Otoscopic inspection reveals clear ear canals with visible tympanic membranes without redness.  Tympanometry showed normal middle ear pressure, volume and compliance bilaterally (Type A).  Ipsilateral acoustic reflexes are present but slightly elevated on the left side and range from elevated to no response on the right side. Distortion Product  Otoacoustic Emission (DPOAE) testing from 2000Hz  - 10,000Hz  were absent bilaterally which supports abnormal/poor outer hair cell function in the cochlea.   CONCLUSION:      Mivaan Corbitt has symmetrical, relatively flat, hearing thresholds that range from moderate low frequency to a moderately severe high frequency sensorineural hearing loss bilaterally (although there is a slight mixed loss on the left side at 250Hz  and 8000Hz ) with recruitment.  This amount of hearing loss will adversely affect communication at normal loudness levels.  As discussed with Mercie Eon normal communication volume is approximately 50-60 dB at distances of 3-5 feet.  With Bari Edward hearing loss of 40-65 dBHL many speech sounds will be missed in quiet and even more will not be audible in minimal background noise.    Mercie Eon has excellent word recognition when the volume is loud enough in quiet (equivalent to a significantly raised voice at 3-5 feet distance) however his word recognition drops to poor in minimal background noise.  As discussed with Mercie Eon, he needs amplification.  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  However, if this is not an option, then further evaluation by an ENT and a hearing evaluation locally is strongly recommended. Amplification helps make the signal louder and therefore often improves hearing and word recognition.  Amplification has many forms including hearing aids in one or both ears, an assistive listening device which have a microphone and speaker such as a small handheld device and/or  even a surround sound system of speakers.  Amplification may be covered by some insurances, but not all.  It is important to note that hearing aids must be individually fit according to the hearing test results and the ear shape.  Audiologists and hearing aid dealers  in [place] must be licensed in order to dispense hearing aids.  In addition, a trial period is mandated by law in our state because often amplification must be tried and then evaluated in order to determine benefit. There are many excellent choices when it comes to amplification in our area and providers are listed in the phone book under hearing aids, may be affiliated with Ear, Nose and Throat physicians, are located at [facility] and [facility] as well as the [facility] speech and hearing center.  Finally, Mercie Eon showed me an audiogram obtained about two years ago which showed stable high frequency hearing thresholds, with the lower frequencies about 10-15 dB poorer today.  Although there may be some test retest variability - close monitoring of Devell Jicha'[s hearing to ensure hearing is stable is strongly recommended.     RECOMMENDATIONS: 1.   Monitor hearing closely with a repeat audiological evaluation every 3-6 months (earlier if there is any change in hearing or ear pressure) to measure. This test may be completed here, with an ENT affiliated audiologist or as part of a hearing aid evaluatuion. 2.   Follow-up with the [facility] regarding a hearing aid evaluation.  3.   Consider further evaluation by an Ear, Nose and Throat physician, through the [facility] or privately since he has significant hearing loss for his age.   4. To improve listening in background noise, consider the use of the at home computer programs that help improve hearing in background noise such as LACE through [employer] or Auditory Workout through [employer].  Ideally a program of this type would be used after hearing aid fitting to enhance learning to use the amplification effectively. Research is showing that using a program of this type 10-15 minutes 4-5 days per week improves auditory processing of speech sounds. 5.   Information for obtaining "CaptionCall" assistive telephone technology was given to  Mercie Eon for consideration if he would like this service at home. 6.   Strategies that help improve hearing with or without amplification include: A) Face the speaker directly. Optimal is having the speakers face well - lit.  Unless amplified, being within 3-5 feet of Soham Hollett  will enhance word recognition. B) Avoid having the speaker back-lit as this will minimize the ability to use cues from lip-reading, facial expression and gestures. C)  Word recognition is poorer in background noise. For optimal word recognition, turn off the TV, radio or noisy fan when engaging in conversation. In a restaurant, try to sit away from noise sources and close to the primary speaker. Please be aware that in general the kitchen is a very poor listening environment with water noise, refrigerator/dishwasher noise and often poor reverberation. D)  Ask for topic clarification from time to time in order to remain in the conversation.  Most people don't mind repeating or clarifying a point when asked.  If needed, explain the difficulty hearing in background noise or hearing loss. E) Use a gentle touch or visually get attention prior to communication to minimize frustration. F)  TV amplification devices are also available if the loudness bothers others in the home or Ajdin Macke is having difficulty with the TV clarity.  Raelan Burgoon L. Kate Sable,  Au.D., CCC-A Doctor of Audiology 06/24/2015
